# Patient Record
Sex: Male | Born: 1955 | Race: White | Hispanic: No | Marital: Married | Smoking: Current every day smoker
Health system: Southern US, Community
[De-identification: ages and names within clinical notes are randomized; demographics above are authoritative.]

## PROBLEM LIST (undated history)

## (undated) DIAGNOSIS — G35 Multiple sclerosis: Secondary | ICD-10-CM

## (undated) DIAGNOSIS — I10 Essential (primary) hypertension: Secondary | ICD-10-CM

## (undated) HISTORY — PX: ELBOW SURGERY: SHX618

## (undated) HISTORY — PX: WRIST SURGERY: SHX841

---

## 2013-12-28 ENCOUNTER — Emergency Department (HOSPITAL_COMMUNITY): Payer: Self-pay

## 2013-12-28 ENCOUNTER — Encounter (HOSPITAL_COMMUNITY): Payer: Self-pay | Admitting: Emergency Medicine

## 2013-12-28 ENCOUNTER — Emergency Department (HOSPITAL_COMMUNITY)
Admission: EM | Admit: 2013-12-28 | Discharge: 2013-12-28 | Disposition: A | Discharge status: Home / Self Care | Payer: Self-pay | Attending: Emergency Medicine | Admitting: Emergency Medicine

## 2013-12-28 DIAGNOSIS — Y9289 Other specified places as the place of occurrence of the external cause: Secondary | ICD-10-CM | POA: Insufficient documentation

## 2013-12-28 DIAGNOSIS — Y9389 Activity, other specified: Secondary | ICD-10-CM | POA: Insufficient documentation

## 2013-12-28 DIAGNOSIS — W1809XA Striking against other object with subsequent fall, initial encounter: Secondary | ICD-10-CM | POA: Insufficient documentation

## 2013-12-28 DIAGNOSIS — T2111XA Burn of first degree of chest wall, initial encounter: Secondary | ICD-10-CM | POA: Insufficient documentation

## 2013-12-28 DIAGNOSIS — I10 Essential (primary) hypertension: Secondary | ICD-10-CM | POA: Insufficient documentation

## 2013-12-28 DIAGNOSIS — Y990 Civilian activity done for income or pay: Secondary | ICD-10-CM | POA: Insufficient documentation

## 2013-12-28 DIAGNOSIS — S01501A Unspecified open wound of lip, initial encounter: Secondary | ICD-10-CM | POA: Insufficient documentation

## 2013-12-28 DIAGNOSIS — S2249XA Multiple fractures of ribs, unspecified side, initial encounter for closed fracture: Secondary | ICD-10-CM | POA: Insufficient documentation

## 2013-12-28 DIAGNOSIS — Z8669 Personal history of other diseases of the nervous system and sense organs: Secondary | ICD-10-CM | POA: Insufficient documentation

## 2013-12-28 DIAGNOSIS — IMO0002 Reserved for concepts with insufficient information to code with codable children: Secondary | ICD-10-CM | POA: Insufficient documentation

## 2013-12-28 DIAGNOSIS — F17200 Nicotine dependence, unspecified, uncomplicated: Secondary | ICD-10-CM | POA: Insufficient documentation

## 2013-12-28 HISTORY — DX: Essential (primary) hypertension: I10

## 2013-12-28 HISTORY — DX: Multiple sclerosis: G35

## 2013-12-28 LAB — I-STAT CHEM 8, ED
BUN: 13 mg/dL (ref 6–23)
CALCIUM ION: 1.22 mmol/L (ref 1.12–1.23)
CHLORIDE: 97 meq/L (ref 96–112)
Creatinine, Ser: 1.2 mg/dL (ref 0.50–1.35)
GLUCOSE: 104 mg/dL — AB (ref 70–99)
HCT: 44 % (ref 39.0–52.0)
Hemoglobin: 15 g/dL (ref 13.0–17.0)
Potassium: 3.5 mEq/L — ABNORMAL LOW (ref 3.7–5.3)
Sodium: 141 mEq/L (ref 137–147)
TCO2: 28 mmol/L (ref 0–100)

## 2013-12-28 MED ORDER — IOHEXOL 300 MG/ML  SOLN
80.0000 mL | Freq: Once | INTRAMUSCULAR | Status: AC | PRN
  Administered 2013-12-28: 75 mL via INTRAVENOUS

## 2013-12-28 MED ORDER — SODIUM CHLORIDE 0.9 % IV BOLUS (SEPSIS)
125.0000 mL | Freq: Once | INTRAVENOUS | Status: AC
  Administered 2013-12-28: 125 mL via INTRAVENOUS

## 2013-12-28 MED ORDER — HYDROCODONE-ACETAMINOPHEN 5-325 MG PO TABS: 1.0000 | ORAL_TABLET | ORAL | Status: AC | PRN

## 2013-12-28 NOTE — ED Notes (Signed)
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 There were other workers up there with him prior to the accident. No crepitus to chest. Tenderness and bruising to left scapula and left upper chest area. VSS by EMS, 18g to [facility]. Given 100 mcg Fentanyl and 4 mg Zofran. Pt a/ox4 during transport and on arrival to ED.

## 2013-12-28 NOTE — ED Provider Notes (Signed)
CSN: [id number]     Arrival date & time 12/28/13  1336 History   First MD Initiated Contact with Patient 12/28/13 1342     Chief Complaint  Patient presents with  . Trauma     (Consider location/radiation/quality/duration/timing/severity/associated sxs/prior Treatment) Patient is a 58 y.o. male presenting with trauma. The history is provided by the patient.  Trauma Mechanism of injury: crush injury Injury location: torso Injury location detail: L chest and back Incident location: at work Time since incident: 30 minutes Arrived directly from scene: yes  Crush injury:      Mechanism: industrial machinery      Duration of crushing force: 5 minutes      Approximate weight of object: Unknown       Suspicion of alcohol use: no      Suspicion of drug use: no  EMS/PTA data:      Bystander interventions: bystander C-spine precautions      Ambulatory at scene: yes      Blood loss: minimal      Responsiveness: alert      Oriented to: person, place, situation and time      Loss of consciousness: no      Amnesic to event: no      Airway interventions: none      Immobilization: C-collar and long board  Current symptoms:      Associated symptoms:            Reports back pain (over left scapula).            Denies chest pain and loss of consciousness.   Relevant PMH:      Tetanus status: UTD   Past Medical History  Diagnosis Date  . Hypertension   . Multiple sclerosis    Past Surgical History  Procedure Laterality Date  . Wrist surgery Right   . Elbow surgery Left    No family history on file. History  Substance Use Topics  . Smoking status: Current Every Day Smoker  . Smokeless tobacco: Not on file  . Alcohol Use: No    Review of Systems  Cardiovascular: Negative for chest pain.  Musculoskeletal: Positive for back pain (over left scapula).  Neurological: Negative for loss of consciousness.  All other systems reviewed and are negative.     Allergies  Review of  patient's allergies indicates no known allergies.  Home Medications   Prior to Admission medications   Not on File   BP 122/81  Pulse 78  Temp(Src) 98.5 F (36.9 C) (Oral)  Resp 18  Ht 6' (1.829 m)  Wt 190 lb (86.183 kg)  BMI 25.76 kg/m2  SpO2 97% Physical Exam  Constitutional: He is oriented to person, place, and time. He appears well-developed and well-nourished. No distress.  HENT:  Head: Normocephalic. Head is with laceration (Of right upper lip that is hemostatic and well approximated without intervention).  Eyes: Conjunctivae are normal.  Neck: Neck supple. No tracheal deviation present.  Cardiovascular: Normal rate and regular rhythm.   Pulmonary/Chest: Effort normal. No respiratory distress. He exhibits no crepitus, no edema, no deformity, no swelling and no retraction.  Mild erythema over the left anterior chest wall, tenderness over the left scapula without any breakage in the skin  Abdominal: Soft. He exhibits no distension.  Neurological: He is alert and oriented to person, place, and time.  Skin: Skin is warm and dry.  Psychiatric: He has a normal mood and affect.    ED Course  Procedures (  including critical care time) Labs Review Labs Reviewed  I-STAT CHEM 8, ED - Abnormal; Notable for the following:    Potassium 3.5 (*)    Glucose, Bld 104 (*)    All other components within normal limits    Imaging Review Ct Chest W Contrast  12/28/2013   CLINICAL DATA:  Recent traumatic injury with pain in the chest  EXAM: CT CHEST WITH CONTRAST  TECHNIQUE: Multidetector CT imaging of the chest was performed during intravenous contrast administration.  CONTRAST:  75mL OMNIPAQUE IOHEXOL 300 MG/ML  SOLN  COMPARISON:  None.  FINDINGS: The lungs are well aerated bilaterally without evidence of focal infiltrate or sizable effusion. No pneumothorax is noted. The thoracic aorta shows dilatation in the ascending arch 2 4.8 cm. It tapers in a normal fashion distally. . The visualized  portions of the pulmonary artery are unremarkable. No hilar or mediastinal adenopathy is seen. The upper abdomen shows no visceral injury.  The osseous structures show mildly displaced fractures in the anterior aspect of the left second, third and fourth ribs. Again no pneumothorax is noted.  IMPRESSION: Mildly displaced fractures of the left second, third and fourth ribs. No complicating factors are noted.  Dilatation of the ascending aorta to 4.8 cm. No dissection is noted.   Electronically Signed   By: Alcide CleverMark  Lukens M.D.   On: 12/28/2013 14:52   Dg Chest Port 1 View  12/28/2013   CLINICAL DATA:  Crush injury to chest.  EXAM: PORTABLE CHEST - 1 VIEW  COMPARISON:  None.  FINDINGS: Trachea is midline. Heart size normal. Lungs are clear. No pleural fluid. No pneumothorax. Osseous structures appear grossly intact.  IMPRESSION: No acute findings.   Electronically Signed   By: Leanna BattlesMelinda  Blietz M.D.   On: 12/28/2013 13:53     EKG Interpretation None      MDM   Final diagnoses:  Multiple rib fractures   58 y.o. male presents after a crush injury where some machinery at his work fell over and he became pain between 2 large pieces of metal. A truck with a bucket that she had to be used to remove the object. He had no loss of consciousness and the only trauma to his head was a small laceration to the right side of his upper lip which is hemostatic at this time.  Due to the nature of the patient's injuries and continued left scapular tenderness a screening x-ray and CT of the chest are ordered for evaluation for dramatic injury. He does not have any evidence of abdominal trauma nor does any tenderness over his abdomen or pelvis.  The patient's CT shows multiple rib fractures, 3 total, without evidence of hemopneumothorax. He was advised on pain control, incentive spirometry, and supportive care measures with followup in primary care for reevaluation or return to the emergency department with shortness of  breath, fevers, worsening cough or other concerning symptoms.   Lyndal Pulleyaniel Ercilia Bettinger, MD 12/28/13 [phone]

## 2013-12-28 NOTE — ED Provider Notes (Signed)
I saw and evaluated the patient, reviewed the resident's note and I agree with the findings and plan.   EKG Interpretation None      See separate note  Shon Baton, MD 12/28/13 [phone]

## 2013-12-28 NOTE — ED Provider Notes (Signed)
I saw and evaluated the patient, reviewed the resident's note and I agree with the findings and plan.   EKG Interpretation None      Patient presents as a level II trauma. Patient was pinned under heavy metal equipment over his left chest and back. Vital signs stable in route. ABCs intact upon arrival. No obvious crepitus and equal breath sounds bilaterally. Patient has bruising over the left chest and left back with tenderness to palpation over the scapula. No abdominal pain. Patient also has a laceration to the right side of the lip. Teeth are stable and midface is stable. C-collar was cleared at the bedside.  Rib fractures on chest CT x3.  Aggressive pain management, incentive spirometry.  Patient ambulatory and able to tolerate PO.    After history, exam, and medical workup I feel the patient has been appropriately medically screened and is safe for discharge home. Pertinent diagnoses were discussed with the patient. Patient was given return precautions.   Shon Baton, MD 12/28/13 [phone]

## 2013-12-28 NOTE — Discharge Instructions (Signed)
Rib Fracture  A rib fracture is a break or crack in one of the bones of the ribs. The ribs are a group of long, curved bones that wrap around your chest and attach to your spine. They protect your lungs and other organs in the chest cavity. A broken or cracked rib is often painful, but most do not cause other problems. Most rib fractures heal on their own over time. However, rib fractures can be more serious if multiple ribs are broken or if broken ribs move out of place and push against other structures.  CAUSES   · A direct blow to the chest. For example, this could happen during contact sports, a car accident, or a fall against a hard object.  · Repetitive movements with high force, such as pitching a baseball or having severe coughing spells.  SYMPTOMS   · Pain when you breathe in or cough.  · Pain when someone presses on the injured area.  DIAGNOSIS   Your caregiver will perform a physical exam. Various imaging tests may be ordered to confirm the diagnosis and to look for related injuries. These tests may include a chest X-ray, computed tomography (CT), magnetic resonance imaging (MRI), or a bone scan.  TREATMENT   Rib fractures usually heal on their own in 1 3 months. The longer healing period is often associated with a continued cough or other aggravating activities. During the healing period, pain control is very important. Medication is usually given to control pain. Hospitalization or surgery may be needed for more severe injuries, such as those in which multiple ribs are broken or the ribs have moved out of place.   HOME CARE INSTRUCTIONS   · Avoid strenuous activity and any activities or movements that cause pain. Be careful during activities and avoid bumping the injured rib.  · Gradually increase activity as directed by your caregiver.  · Only take over-the-counter or prescription medications as directed by your caregiver. Do not take other medications without asking your caregiver first.  · Apply ice  to the injured area for the first 1 2 days after you have been treated or as directed by your caregiver. Applying ice helps to reduce inflammation and pain.  · Put ice in a plastic bag.  · Place a towel between your skin and the bag.    · Leave the ice on for 15 20 minutes at a time, every 2 hours while you are awake.  · Perform deep breathing as directed by your caregiver. This will help prevent pneumonia, which is a common complication of a broken rib. Your caregiver may instruct you to:  · Take deep breaths several times a day.  · Try to cough several times a day, holding a pillow against the injured area.  · Use a device called an incentive spirometer to practice deep breathing several times a day.  · Drink enough fluids to keep your urine clear or pale yellow. This will help you avoid constipation.    · Do not wear a rib belt or binder. These restrict breathing, which can lead to pneumonia.    SEEK IMMEDIATE MEDICAL CARE IF:   · You have a fever.    · You have difficulty breathing or shortness of breath.    · You develop a continual cough, or you cough up thick or bloody sputum.  · You feel sick to your stomach (nausea), throw up (vomit), or have abdominal pain.    · You have worsening pain not controlled with medications.      MAKE SURE YOU:  · Understand these instructions.  · Will watch your condition.  · Will get help right away if you are not doing well or get worse.  Document Released: 07/16/2005 Document Revised: 03/18/2013 Document Reviewed: 09/17/2012  [employer]® Patient Information ©2014 [employer].

## 2013-12-31 ENCOUNTER — Telehealth (HOSPITAL_COMMUNITY): Payer: Self-pay

## 2014-11-19 IMAGING — CT CT CHEST W/ CM
2 of 3 series · 15 of 36 positions shown, 18 images · IV contrast (Omni 300)
Comparison: None.

CLINICAL DATA: Recent traumatic injury with pain in the chest

EXAM:
CT CHEST WITH CONTRAST
TECHNIQUE: Multidetector CT imaging of the chest was performed during
intravenous contrast administration.
CONTRAST:  75mL OMNIPAQUE IOHEXOL 300 MG/ML  SOLN

[Series 2: thorax 5.0 i31f 1 · axial · 0.78mm/px · z∈[+1163,+1408]mm · 12 of 59 slices shown, 15 images]
[im 5/59  mediastinal]
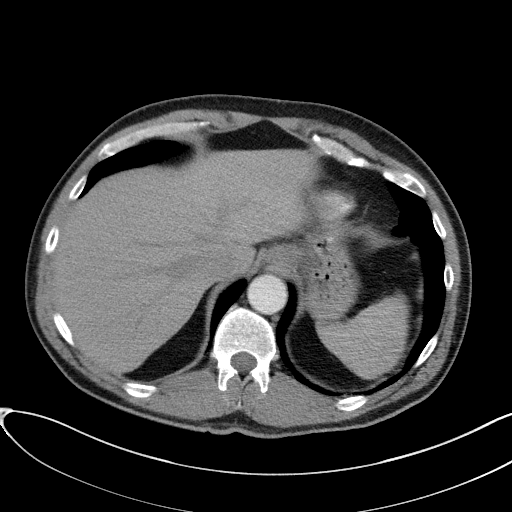
[im 5/59  lung]
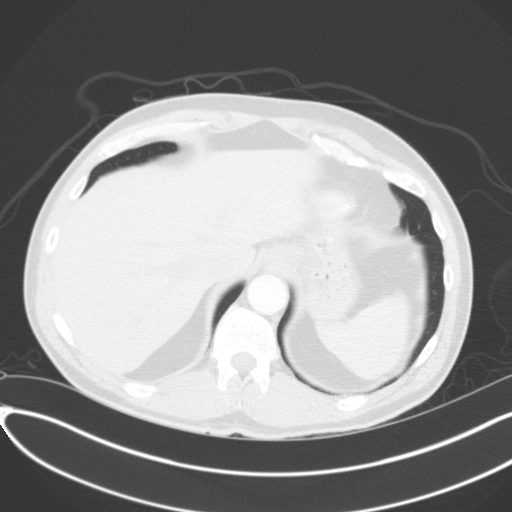
[im 9/59  lung]
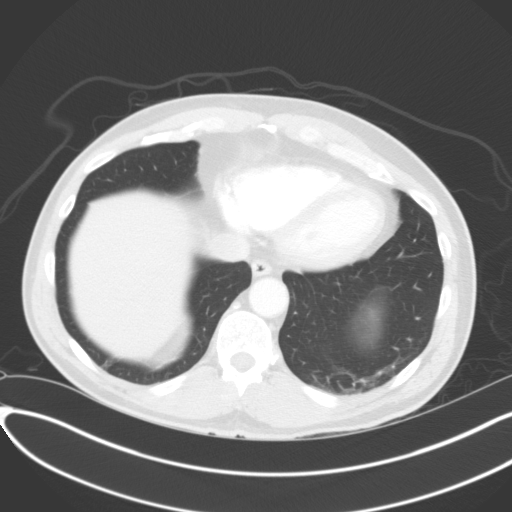
[im 13/59  lung]
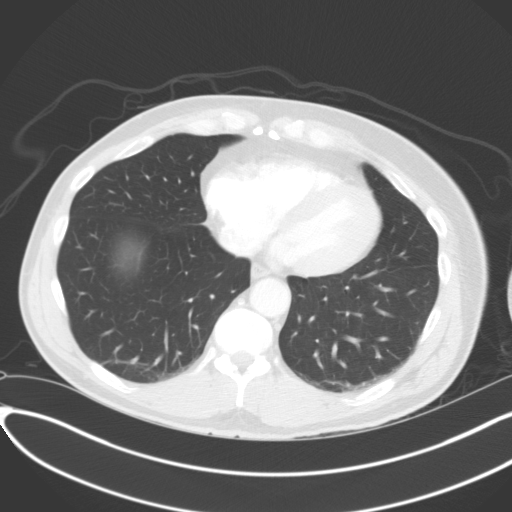
[im 18/59  lung]
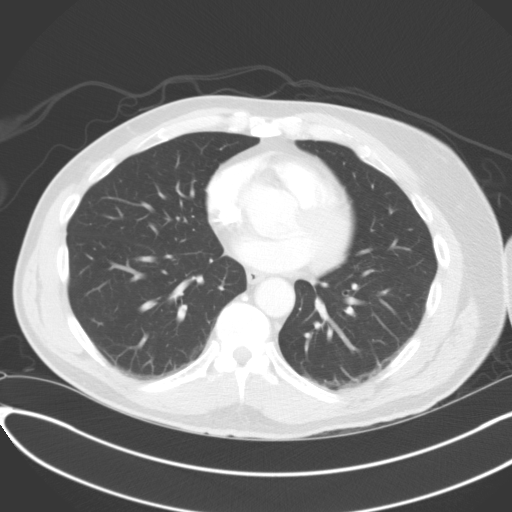
[im 22/59  mediastinal]
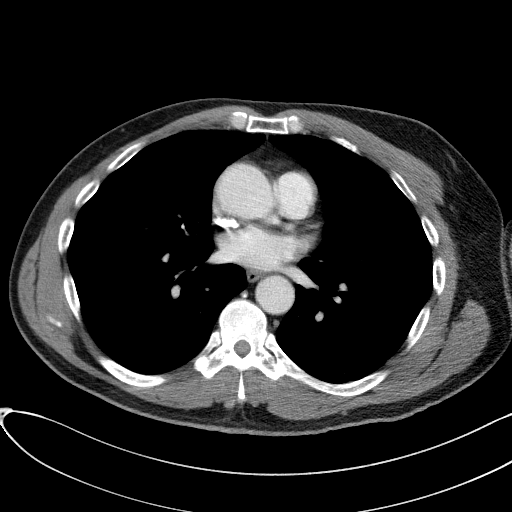
[im 22/59  lung]
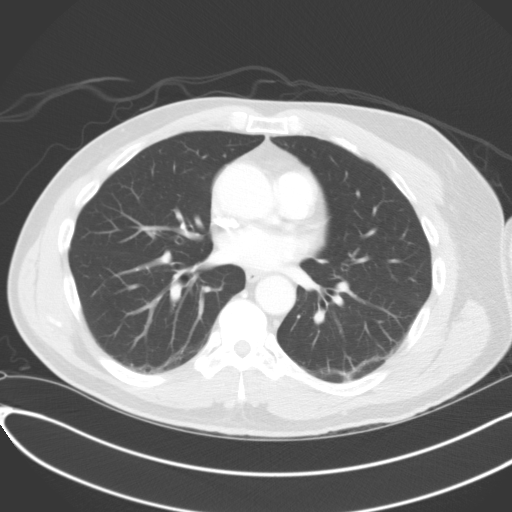
[im 26/59  lung]
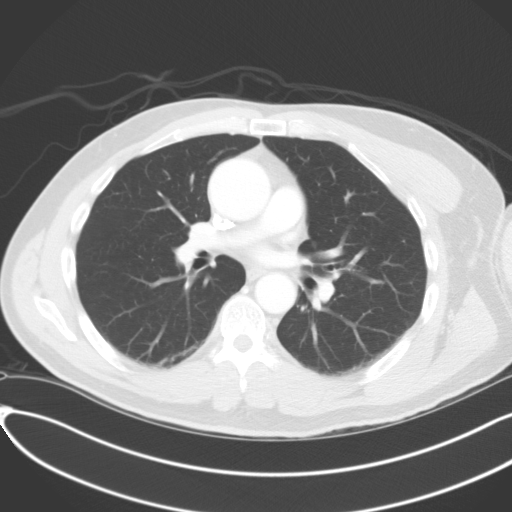
[im 33/59  lung]
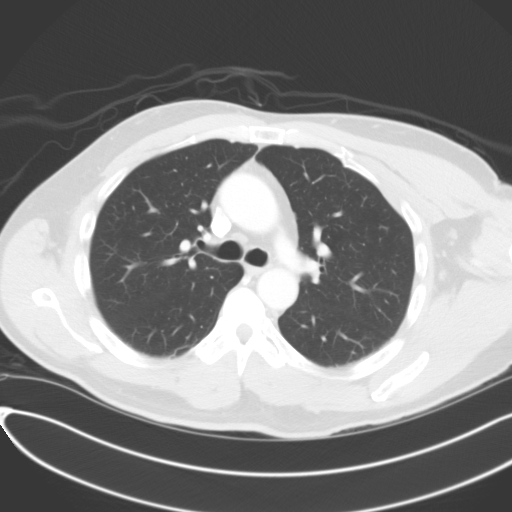
[im 37/59  lung]
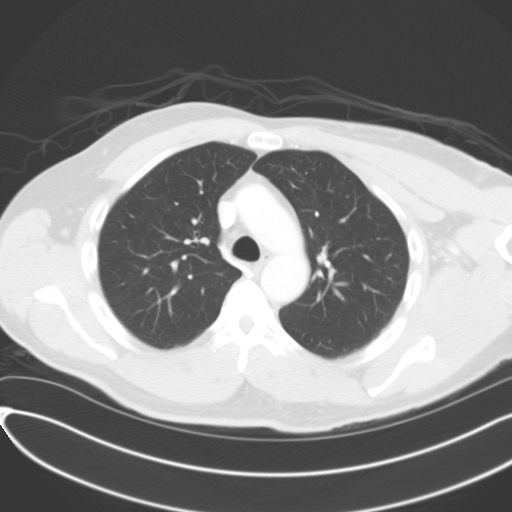
[im 41/59  mediastinal]
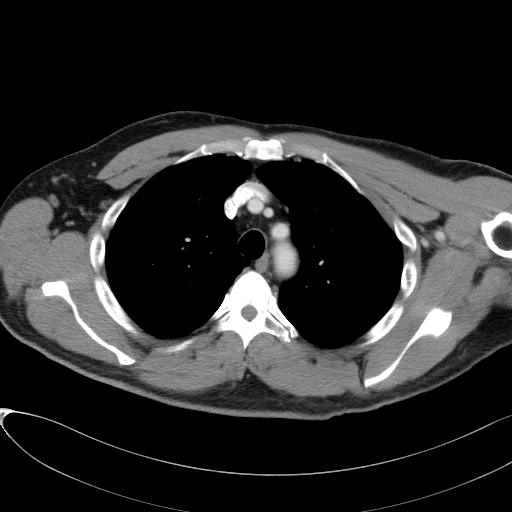
[im 41/59  lung]
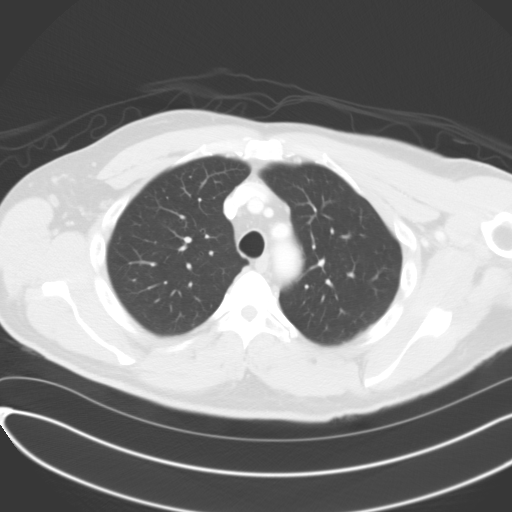
[im 46/59  lung]
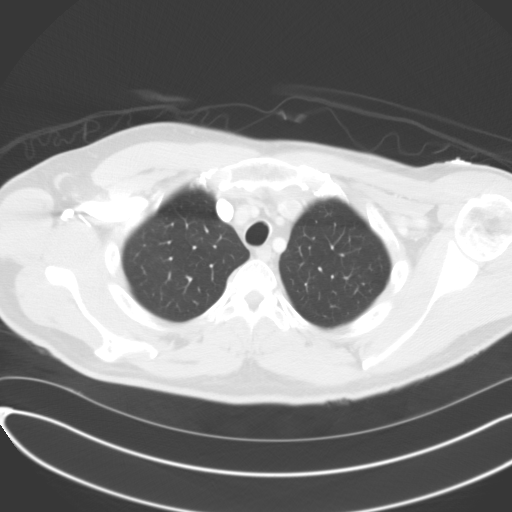
[im 50/59  lung]
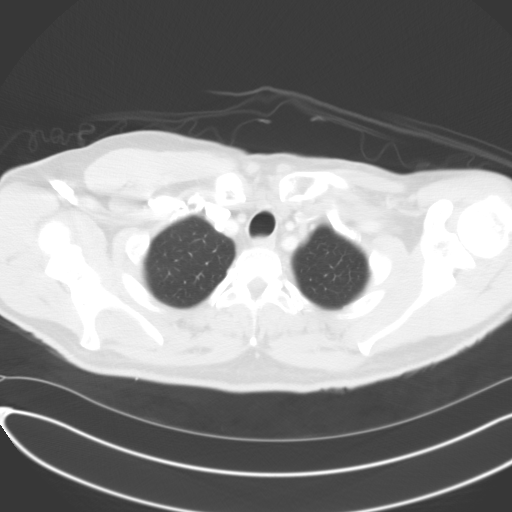
[im 54/59  lung]
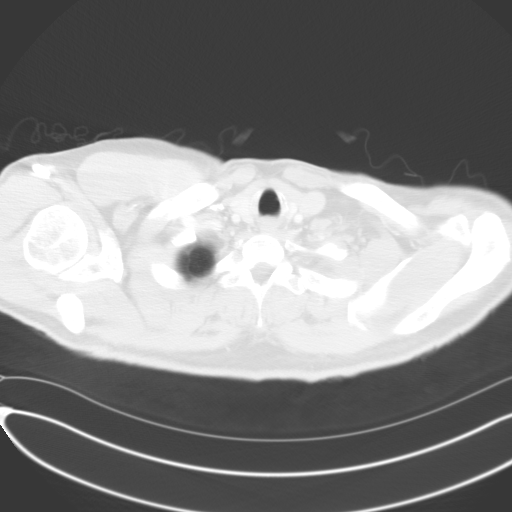

[Series 5: coronal · coronal · 0.83mm/px · 3 of 101 slices shown]
[im 21/101  lung]
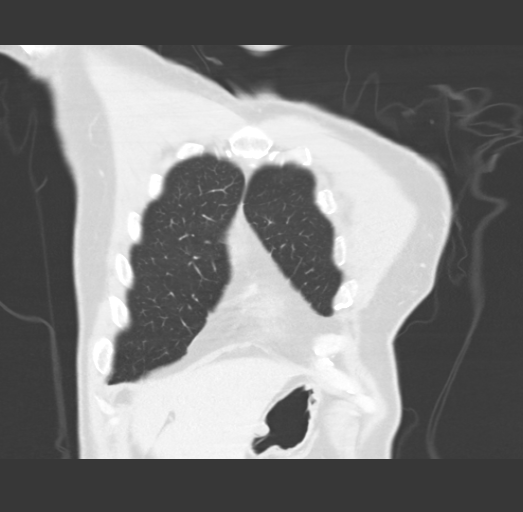
[im 41/101  lung]
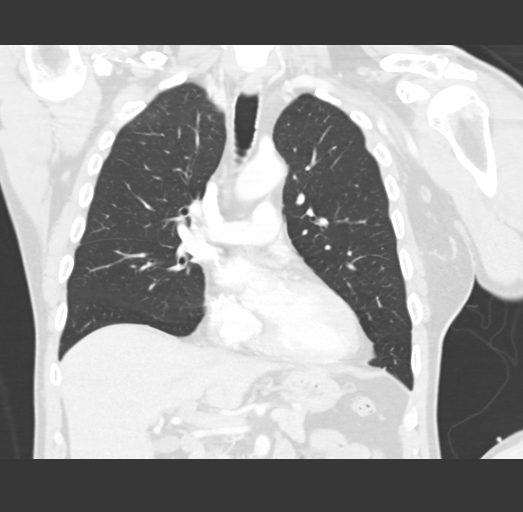
[im 61/101  lung]
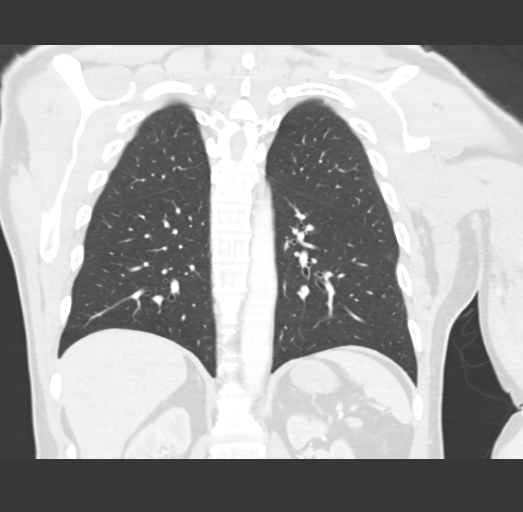

[15 of 36 positions shown; findings below may reference images not displayed]

FINDINGS: The lungs are well aerated bilaterally without evidence of focal
infiltrate or sizable effusion. No pneumothorax is noted. The
thoracic aorta shows dilatation in the ascending arch 2 4.8 cm. It
tapers in a normal fashion distally. . The visualized portions of
the pulmonary artery are unremarkable. No hilar or mediastinal
adenopathy is seen. The upper abdomen shows no visceral injury.

The osseous structures show mildly displaced fractures in the
anterior aspect of the left second, third and fourth ribs. Again no
pneumothorax is noted.
IMPRESSION: Mildly displaced fractures of the left second, third and fourth
ribs. No complicating factors are noted.

Dilatation of the ascending aorta to 4.8 cm. No dissection is noted.
